# Patient Record
Sex: Female | Born: 1965 | Race: White | Hispanic: No | Marital: Married | Smoking: Never smoker
Health system: Southern US, Community
[De-identification: ages and names within clinical notes are randomized; demographics above are authoritative.]

## PROBLEM LIST (undated history)

## (undated) HISTORY — PX: REDUCTION MAMMAPLASTY: SUR839

---

## 2013-02-24 DIAGNOSIS — E559 Vitamin D deficiency, unspecified: Secondary | ICD-10-CM | POA: Insufficient documentation

## 2015-12-07 ENCOUNTER — Ambulatory Visit
Admission: EM | Admit: 2015-12-07 | Discharge: 2015-12-07 | Disposition: A | Discharge status: Home / Self Care | Payer: No Typology Code available for payment source | Attending: Family Medicine | Admitting: Family Medicine

## 2015-12-07 ENCOUNTER — Encounter: Payer: Self-pay | Admitting: *Deleted

## 2015-12-07 ENCOUNTER — Ambulatory Visit (INDEPENDENT_AMBULATORY_CARE_PROVIDER_SITE_OTHER): Payer: No Typology Code available for payment source

## 2015-12-07 DIAGNOSIS — M25511 Pain in right shoulder: Secondary | ICD-10-CM

## 2015-12-07 DIAGNOSIS — M7501 Adhesive capsulitis of right shoulder: Secondary | ICD-10-CM | POA: Diagnosis not present

## 2015-12-07 DIAGNOSIS — M7521 Bicipital tendinitis, right shoulder: Secondary | ICD-10-CM

## 2015-12-07 MED ORDER — MELOXICAM 15 MG PO TABS: 15.0000 mg | ORAL_TABLET | Freq: Every day | ORAL | Status: DC

## 2015-12-07 MED ORDER — ORPHENADRINE CITRATE ER 100 MG PO TB12: 100.0000 mg | ORAL_TABLET | Freq: Two times a day (BID) | ORAL | Status: DC
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
## 2015-12-07 NOTE — Discharge Instructions (Signed)
Shoulder Pain The shoulder is the joint that connects your arm to your body. Muscles and band-like tissues that connect bones to muscles (tendons) hold the joint together. Shoulder pain is felt if an injury or medical problem affects one or more parts of the shoulder. HOME CARE   Put ice on the sore area.  Put ice in a plastic bag.  Place a towel between your skin and the bag.  Leave the ice on for 15-20 minutes, 03-04 times a day for the first 2 days.  Stop using cold packs if they do not help with the pain.  If you were given something to keep your shoulder from moving (sling; shoulder immobilizer), wear it as told. Only take it off to shower or bathe.  Move your arm as little as possible, but keep your hand moving to prevent puffiness (swelling).  Squeeze a soft ball or foam pad as much as possible to help prevent swelling.  Take medicine as told by your doctor. GET HELP IF:  You have progressing new pain in your arm, hand, or fingers.  Your hand or fingers get cold.  Your medicine does not help lessen your pain. GET HELP RIGHT AWAY IF:   Your arm, hand, or fingers are numb or tingling.  Your arm, hand, or fingers are puffy (swollen), painful, or turn white or blue. MAKE SURE YOU:   Understand these instructions.  Will watch your condition.  Will get help right away if you are not doing well or get worse.   This information is not intended to replace advice given to you by your health care provider. Make sure you discuss any questions you have with your health care provider.   Document Released: 03/03/2008 Document Revised: 10/06/2014 Document Reviewed: 01/08/2015 Elsevier Interactive Patient Education 2016 [employer]  Shoulder Range of Motion Exercises Shoulder range of motion (ROM) exercises are designed to keep the shoulder moving freely. They are often recommended for people who have shoulder pain. MOVEMENT EXERCISE When you are able, do this exercise 5-6  days per week, or as told by your health care provider. Work toward doing 2 sets of 10 swings. Pendulum Exercise How To Do This Exercise Lying Down  Lie face-down on a bed with your abdomen close to the side of the bed.  Let your arm hang over the side of the bed.  Relax your shoulder, arm, and hand.  Slowly and gently swing your arm forward and back. Do not use your neck muscles to swing your arm. They should be relaxed. If you are struggling to swing your arm, have someone gently swing it for you. When you do this exercise for the first time, swing your arm at a 15 degree angle for 15 seconds, or swing your arm 10 times. As pain lessens over time, increase the angle of the swing to 30-45 degrees.  Repeat steps 1-4 with the other arm. How To Do This Exercise While Standing  Stand next to a sturdy chair or table and hold on to it with your hand.  Bend forward at the waist.  Bend your knees slightly.  Relax your other arm and let it hang limp.  Relax the shoulder blade of the arm that is hanging and let it drop.  While keeping your shoulder relaxed, use body motion to swing your arm in small circles. The first time you do this exercise, swing your arm for about 30 seconds or 10 times. When you do it next time, swing  your arm for a little longer.  Stand up tall and relax.  Repeat steps 1-7, this time changing the direction of the circles.  Repeat steps 1-8 with the other arm. STRETCHING EXERCISES Do these exercises 3-4 times per day on 5-6 days per week or as told by your health care provider. Work toward holding the stretch for 20 seconds. Stretching Exercise 1  Lift your arm straight out in front of you.  Bend your arm 90 degrees at the elbow (right angle) so your forearm goes across your body and looks like the letter "L."  Use your other arm to gently pull the elbow forward and across your body.  Repeat steps 1-3 with the other arm. Stretching Exercise 2 You will need a  towel or rope for this exercise.  Bend one arm behind your back with the palm facing outward.  Hold a towel with your other hand.  Reach the arm that holds the towel above your head, and bend that arm at the elbow. Your wrist should be behind your neck.  Use your free hand to grab the free end of the towel.  With the higher hand, gently pull the towel up behind you.  With the lower hand, pull the towel down behind you.  Repeat steps 1-6 with the other arm. STRENGTHENING EXERCISES Do each of these exercises at four different times of day (sessions) every day or as told by your health care provider. To begin with, repeat each exercise 5 times (repetitions). Work toward doing 3 sets of 12 repetitions or as told by your health care provider. Strengthening Exercise 1 You will need a light weight for this activity. As you grow stronger, you may use a heavier weight.  Standing with a weight in your hand, lift your arm straight out to the side until it is at the same height as your shoulder.  Bend your arm at 90 degrees so that your fingers are pointing to the ceiling.  Slowly raise your hand until your arm is straight up in the air.  Repeat steps 1-3 with the other arm. Strengthening Exercise 2 You will need a light weight for this activity. As you grow stronger, you may use a heavier weight.  Standing with a weight in your hand, gradually move your straight arm in an arc, starting at your side, then out in front of you, then straight up over your head.  Gradually move your other arm in an arc, starting at your side, then out in front of you, then straight up over your head.  Repeat steps 1-2 with the other arm. Strengthening Exercise 3 You will need an elastic band for this activity. As you grow stronger, gradually increase the size of the bands or increase the number of bands that you use at one time.  While standing, hold an elastic band in one hand and raise that arm up in the  air.  With your other hand, pull down the band until that hand is by your side.  Repeat steps 1-2 with the other arm.   This information is not intended to replace advice given to you by your health care provider. Make sure you discuss any questions you have with your health care provider.   Document Released: 06/14/2003 Document Revised: 01/30/2015 Document Reviewed: 09/11/2014 Elsevier Interactive Patient Education 2016 [employer]  Adhesive Capsulitis Adhesive capsulitis is inflammation of the tendons and ligaments that surround the shoulder joint (shoulder capsule). This condition causes the shoulder to become  stiff and painful to move. Adhesive capsulitis is also called frozen shoulder. CAUSES This condition may be caused by:  An injury to the shoulder joint.  Straining the shoulder.  Not moving the shoulder for a period of time. This can happen if your arm was injured or in a sling.  Long-standing health problems, such as:  Diabetes.  Thyroid problems.  Heart disease.  Stroke.  Rheumatoid arthritis.  Lung disease. In some cases, the cause may not be known. RISK FACTORS This condition is more likely to develop in:  Women.  People who are older than 50 years of age. SYMPTOMS Symptoms of this condition include:  Pain in the shoulder when moving the arm. There may also be pain when parts of the shoulder are touched. The pain is worse at night or when at rest.  Soreness or aching in the shoulder.  Inability to move the shoulder normally.  Muscle spasms. DIAGNOSIS This condition is diagnosed with a physical exam and imaging tests, such as an X-ray or MRI. TREATMENT This condition may be treated with:  Treatment of the underlying cause or condition.  Physical therapy. This involves performing exercises to get the shoulder moving again.  Medicine. Medicine may be given to relieve pain, inflammation, or muscle spasms.  Steroid injections into the  shoulder joint.  Shoulder manipulation. This is a procedure to move the shoulder into another position. It is done after you are given a medicine to make you fall asleep (general anesthetic). The joint may also be injected with salt water at high pressure to break down scarring.  Surgery. This may be done in severe cases when other treatments have failed. Although most people recover completely from adhesive capsulitis, some may not regain the full movement of the shoulder. HOME CARE INSTRUCTIONS  Take over-the-counter and prescription medicines only as told by your health care provider.  If you are being treated with physical therapy, follow instructions from your physical therapist.  Avoid exercises that put a lot of demand on your shoulder, such as throwing. These exercises can make pain worse.  If directed, apply ice to the injured area:  Put ice in a plastic bag.  Place a towel between your skin and the bag.  Leave the ice on for 20 minutes, 2-3 times per day. SEEK MEDICAL CARE IF:  You develop new symptoms.  Your symptoms get worse.   This information is not intended to replace advice given to you by your health care provider. Make sure you discuss any questions you have with your health care provider.   Document Released: 07/13/2009 Document Revised: 06/06/2015 Document Reviewed: 01/08/2015 Elsevier Interactive Patient Education 2016 [employer]  Bicipital Tendonitis Bicipital tendonitis refers to redness, soreness, and swelling (inflammation) or irritation of the bicep tendon. The biceps muscle is located between the elbow and shoulder of the inner arm. The tendon heads, similar to pieces of rope, connect the bicep muscle to the shoulder socket. They are called short head and long head tendons. When tendonitis occurs, the long head tendon is inflamed and swollen, and may be thickened or partially torn.  Bicipital tendonitis can occur with other problems as well, such as  arthritis in the shoulder or acromioclavicular joints, tears in the tendons, or other rotator cuff problems.  CAUSES  Overuse of of the arms for overhead activities is the major cause of tendonitis. Many athletes, such as swimmers, baseball players, and tennis players are prone to bicipital tendonitis. Jobs that require manual labor or  routine chores, especially chores involving overhead activities can result in overuse and tendonitis. SYMPTOMS Symptoms may include:  Pain in and around the front of the shoulder. Pain may be worse with overhead motion.  Pain or aching that radiates down the arm.  Clicking or shifting sensations in the shoulder. DIAGNOSIS Your caregiver may perform the following:  Physical exam and tests of the biceps and shoulder to observe range of motion, strength, and stability.  X-rays or magnetic resonance imaging (MRI) to confirm the diagnosis. In most common cases, these tests are not necessary. Since other problems may exist in the shoulder or rotator cuff, additional tests may be recommended. TREATMENT Treatment may include the following:  Medications  Your caregiver may prescribe over-the-counter pain relievers.  Steroid injections, such as cortisone, may be recommended. These may help to reduce inflammation and pain.  Physical Therapy - Your caregiver may recommend gentle exercises with the arm. These can help restore strength and range of motion. They may be done at home or with a physical therapist's supervision and input.  Surgery - Arthroscopic or open surgery sometimes is necessary. Surgery may include:  Reattachment or repair of the tendon at the shoulder socket.  Removal of the damaged section of the tendon.  Anchoring the tendon to a different area of the shoulder (tenodesis). HOME CARE INSTRUCTIONS   Avoid overhead motion of the affected arm or any other motion that causes pain.  Take medication for pain as directed. Do not take these for  more than 3 weeks, unless directed to do so by your caregiver.  Ice the affected area for 20 minutes at a time, 3-4 times per day. Place a towel on the skin over the painful area and the ice or cold pack over the towel. Do not place ice directly on the skin.  Perform gentle exercises at home as directed. These will increase strength and flexibility. PREVENTION  Modify your activities as much as possible to protect your arm. A physical therapist or sports medicine physician can help you understand options for safe motion.  Avoid repetitive overhead pulling, lifting, reaching, and throwing until your caregiver tells you it is ok to resume these activities. SEEK MEDICAL CARE IF:  Your pain worsens.  You have difficulty moving the affected arm.  You have trouble performing any of the self-care instructions. MAKE SURE YOU:   Understand these instructions.  Will watch your condition.  Will get help right away if you are not doing well or get worse.   This information is not intended to replace advice given to you by your health care provider. Make sure you discuss any questions you have with your health care provider.   Document Released: 10/18/2010 Document Revised: 12/08/2011 Document Reviewed: 04/04/2015 Elsevier Interactive Patient Education [employer]

## 2016-01-31 DIAGNOSIS — E611 Iron deficiency: Secondary | ICD-10-CM | POA: Insufficient documentation

## 2017-09-15 DIAGNOSIS — Z800 Family history of malignant neoplasm of digestive organs: Secondary | ICD-10-CM | POA: Insufficient documentation

## 2017-11-15 DIAGNOSIS — I341 Nonrheumatic mitral (valve) prolapse: Secondary | ICD-10-CM | POA: Insufficient documentation

## 2017-11-15 DIAGNOSIS — N6019 Diffuse cystic mastopathy of unspecified breast: Secondary | ICD-10-CM | POA: Insufficient documentation

## 2017-11-15 DIAGNOSIS — D1803 Hemangioma of intra-abdominal structures: Secondary | ICD-10-CM | POA: Insufficient documentation

## 2017-11-15 DIAGNOSIS — E538 Deficiency of other specified B group vitamins: Secondary | ICD-10-CM | POA: Insufficient documentation

## 2017-12-31 LAB — HM COLONOSCOPY

## 2019-07-15 ENCOUNTER — Other Ambulatory Visit: Payer: Self-pay | Admitting: Family Medicine

## 2019-07-15 DIAGNOSIS — Z1231 Encounter for screening mammogram for malignant neoplasm of breast: Secondary | ICD-10-CM

## 2019-10-07 ENCOUNTER — Ambulatory Visit
Admission: RE | Admit: 2019-10-07 | Discharge: 2019-10-07 | Disposition: A | Discharge status: Home / Self Care | Payer: BC Managed Care – PPO | Source: Ambulatory Visit | Attending: Family Medicine | Admitting: Family Medicine

## 2019-10-07 DIAGNOSIS — Z1231 Encounter for screening mammogram for malignant neoplasm of breast: Secondary | ICD-10-CM | POA: Insufficient documentation

## 2019-10-10 ENCOUNTER — Inpatient Hospital Stay
Admission: RE | Admit: 2019-10-10 | Discharge: 2019-10-10 | Disposition: A | Discharge status: Home / Self Care | Payer: Self-pay | Source: Ambulatory Visit | Attending: *Deleted | Admitting: *Deleted

## 2019-10-10 ENCOUNTER — Other Ambulatory Visit: Payer: Self-pay | Admitting: *Deleted

## 2019-10-10 DIAGNOSIS — Z1231 Encounter for screening mammogram for malignant neoplasm of breast: Secondary | ICD-10-CM

## 2020-08-08 IMAGING — MG DIGITAL SCREENING BILAT W/ TOMO W/ CAD
8 series · 8 of 24 positions shown · non-contrast
Comparison: Previous exam(s).

CLINICAL DATA: Screening.

EXAM:
DIGITAL SCREENING BILATERAL MAMMOGRAM WITH TOMO AND CAD

[R CC synth-2D]
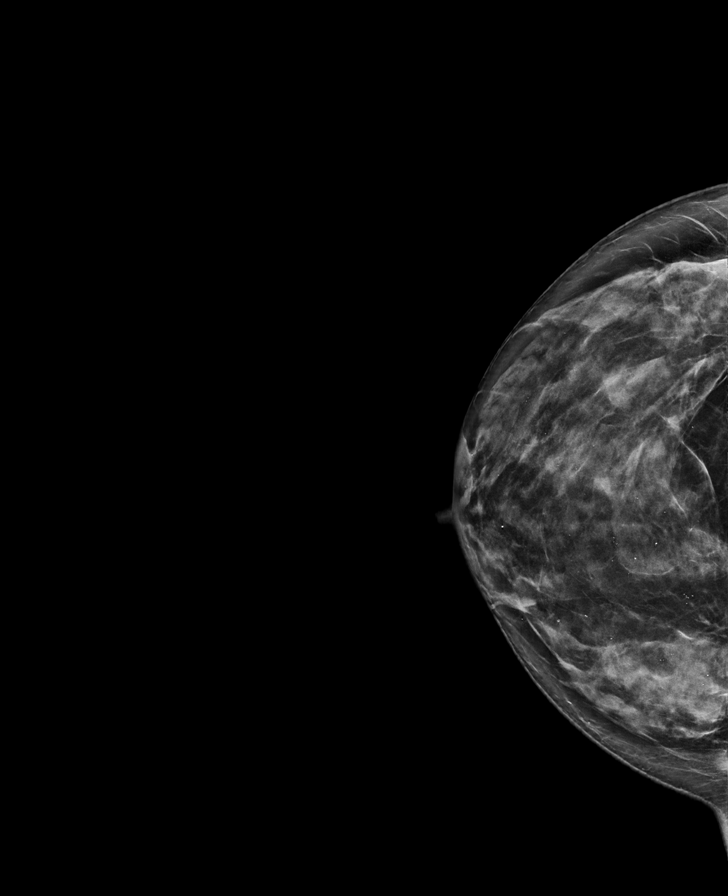

[L CC synth-2D]
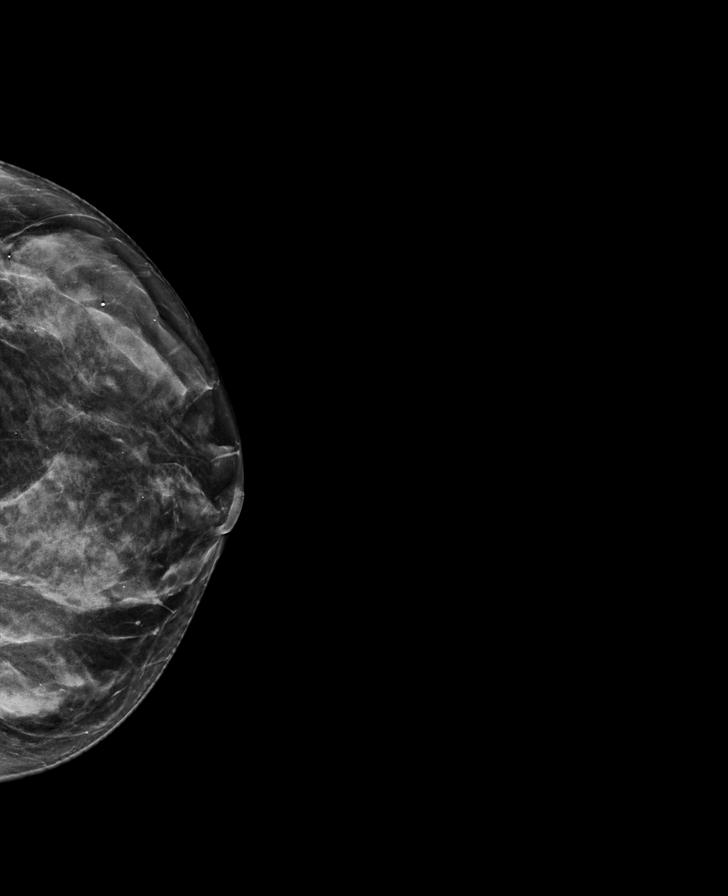

[L MLO synth-2D]
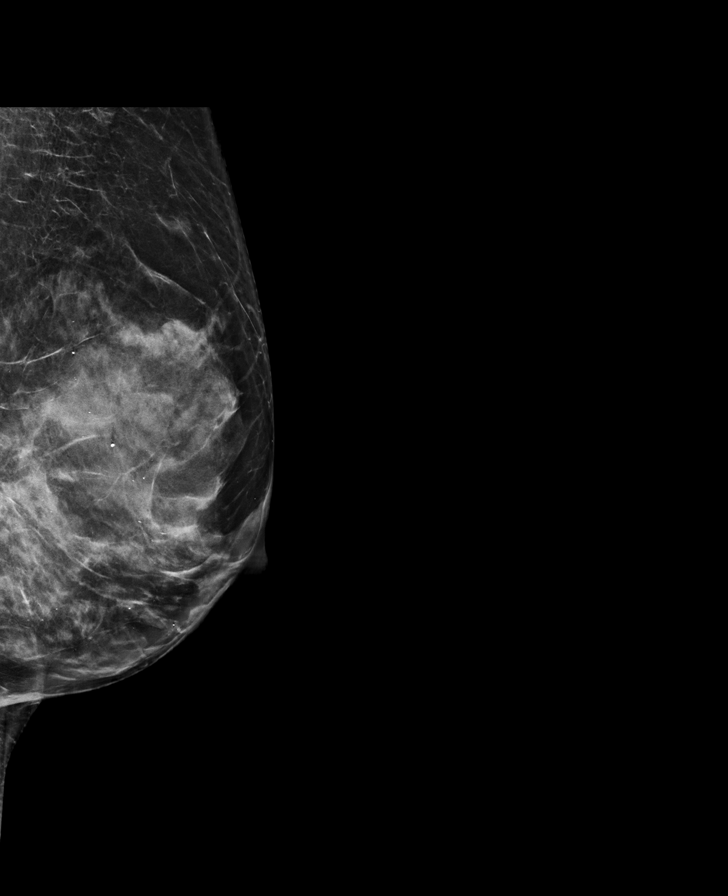

[R MLO synth-2D]
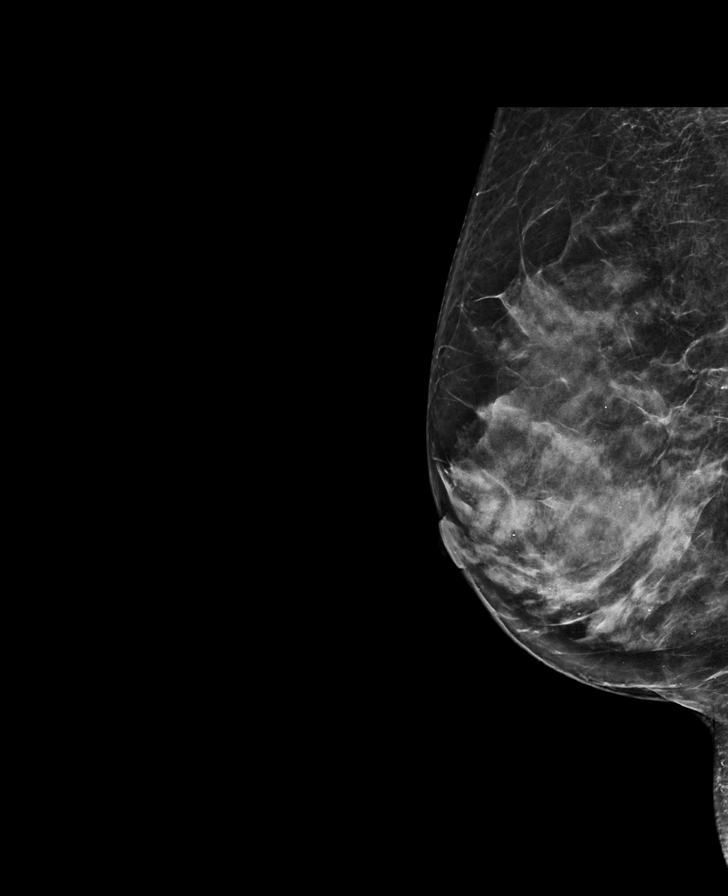

[L MLO tomo · tomo slice 37/73.0]
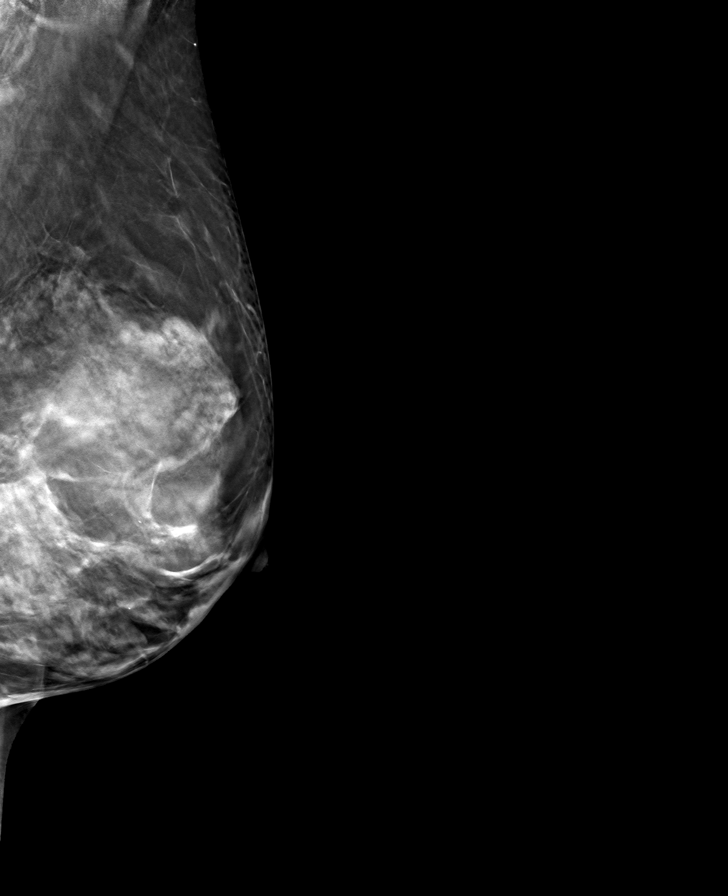

[R MLO tomo · tomo slice 39/77.0]
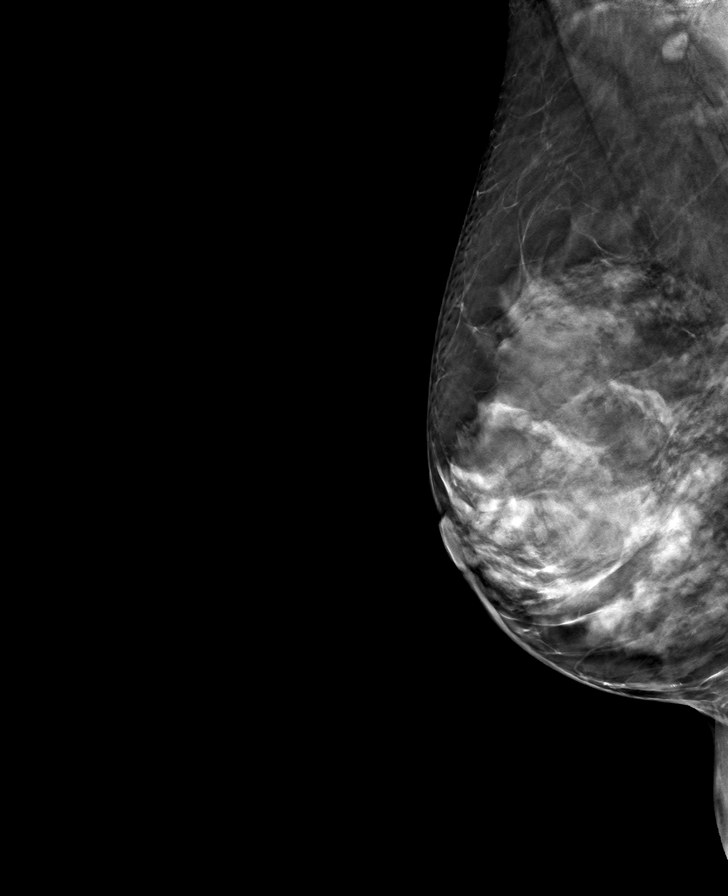

[L CC tomo · tomo slice 33/64.0]
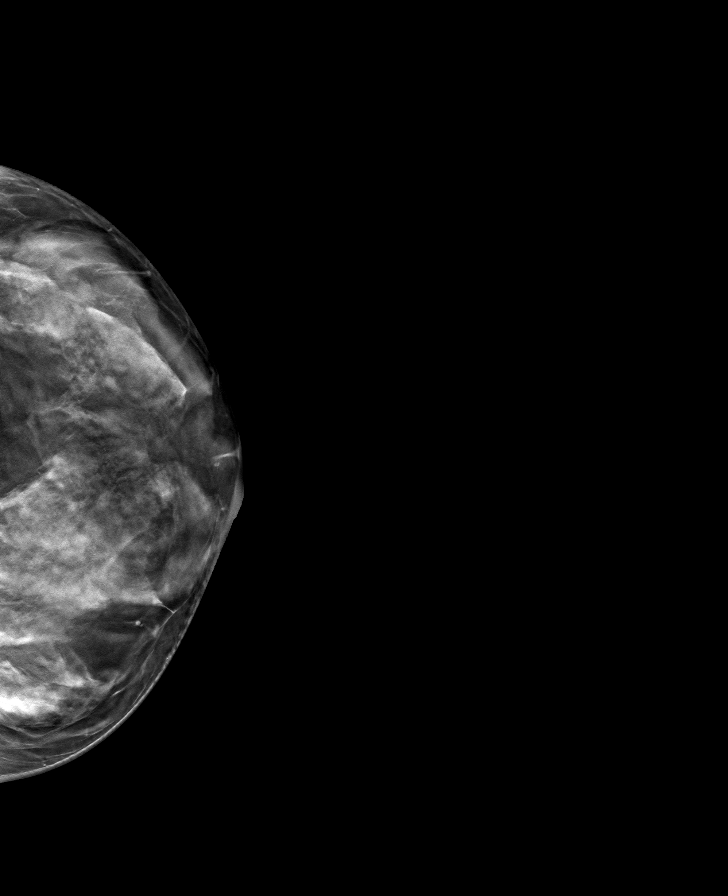

[R CC tomo · tomo slice 37/72.0]
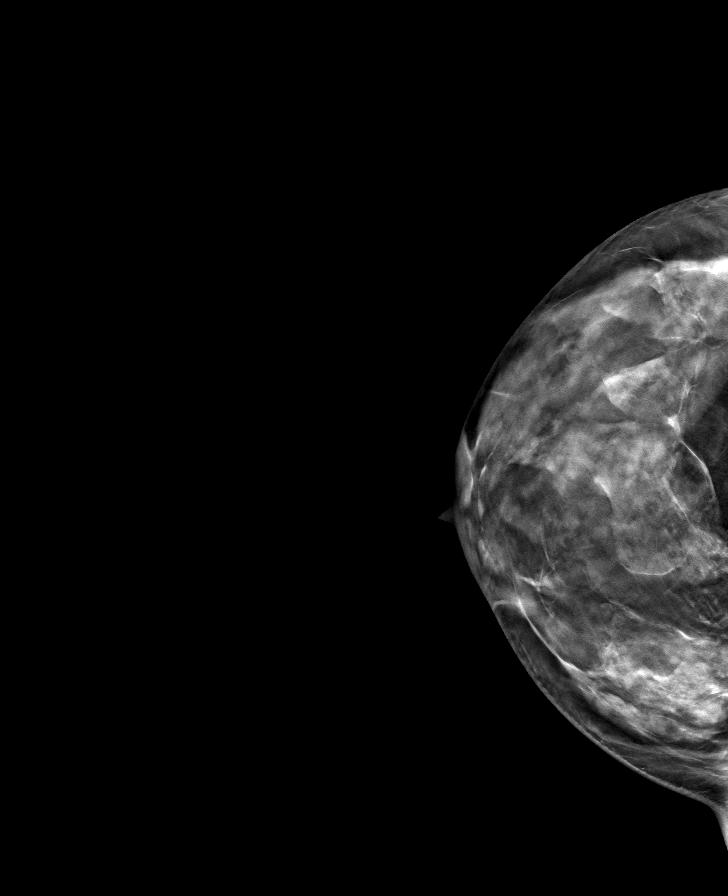

[8 of 24 positions shown; findings below may reference images not displayed]

ACR Breast Density Category d: The breast tissue is extremely dense,
which lowers the sensitivity of mammography
FINDINGS: There are no findings suspicious for malignancy. Images were
processed with CAD.
IMPRESSION: No mammographic evidence of malignancy. A result letter of this
screening mammogram will be mailed directly to the patient.

RECOMMENDATION:
Screening mammogram in one year. (Code:[id number])

BI-RADS CATEGORY  1: Negative.

## 2020-12-14 LAB — CBC AND DIFFERENTIAL
HCT: 40 (ref 36–46)
Hemoglobin: 13.3 (ref 12.0–16.0)
Platelets: 214 10*3/uL (ref 150–400)
WBC: 7.5

## 2020-12-14 LAB — BASIC METABOLIC PANEL
BUN: 17 (ref 4–21)
CO2: 25 — AB (ref 13–22)
Chloride: 106 (ref 99–108)
Creatinine: 0.8 (ref 0.5–1.1)
Glucose: 99
Potassium: 4.1 meq/L (ref 3.5–5.1)
Sodium: 140 (ref 137–147)

## 2020-12-14 LAB — COMPREHENSIVE METABOLIC PANEL
Calcium: 9.7 (ref 8.7–10.7)
eGFR: 88

## 2020-12-14 LAB — HEPATIC FUNCTION PANEL
ALT: 38 U/L — AB (ref 7–35)
AST: 28 (ref 13–35)
Alkaline Phosphatase: 46 (ref 25–125)
Bilirubin, Total: 1.5

## 2021-01-04 ENCOUNTER — Ambulatory Visit (INDEPENDENT_AMBULATORY_CARE_PROVIDER_SITE_OTHER): Payer: BC Managed Care – PPO

## 2021-01-04 ENCOUNTER — Encounter: Payer: Self-pay | Admitting: Emergency Medicine

## 2021-01-04 ENCOUNTER — Other Ambulatory Visit: Payer: Self-pay

## 2021-01-04 ENCOUNTER — Ambulatory Visit
Admission: EM | Admit: 2021-01-04 | Discharge: 2021-01-04 | Disposition: A | Discharge status: Home / Self Care | Payer: BC Managed Care – PPO | Attending: Emergency Medicine | Admitting: Emergency Medicine

## 2021-01-04 DIAGNOSIS — R059 Cough, unspecified: Secondary | ICD-10-CM

## 2021-01-04 DIAGNOSIS — Z8616 Personal history of COVID-19: Secondary | ICD-10-CM

## 2021-01-04 DIAGNOSIS — M546 Pain in thoracic spine: Secondary | ICD-10-CM | POA: Diagnosis present

## 2021-01-04 DIAGNOSIS — M549 Dorsalgia, unspecified: Secondary | ICD-10-CM

## 2021-01-04 LAB — D-DIMER, QUANTITATIVE: D-Dimer, Quant: 0.44 ug/mL-FEU (ref 0.00–0.50)

## 2021-01-04 MED ORDER — TIZANIDINE HCL 4 MG PO TABS: 4.0000 mg | ORAL_TABLET | Freq: Three times a day (TID) | ORAL | 0 refills | Status: DC | PRN

## 2021-01-04 MED ORDER — IBUPROFEN 600 MG PO TABS: 600.0000 mg | ORAL_TABLET | Freq: Four times a day (QID) | ORAL | 0 refills | Status: DC | PRN

## 2021-01-04 NOTE — ED Triage Notes (Addendum)
Patient c/o ongoing cough since Feb.  Patient states that she is now having mid back pain that started a week ago.  Patient denies fevers.  Patient states that she had COVID in Feb 2022.

## 2021-01-04 NOTE — Discharge Instructions (Addendum)
This could be pleurisy or musculoskeletal.  I favor musculoskeletal cause because you were able to reproduce it with certain movements.  I have sent off a D-dimer.  If it is positive, you will need to go to the emergency department to rule out a blood clot in your lungs.  If it is negative, then we can stop worrying about a blood clot in your lungs.  Take 600 mg of ibuprofen combined with 1000 mg of Tylenol together 3-4 times a day as needed for pain.  Zanaflex is for muscle spasms and may help as well.

## 2021-01-04 NOTE — ED Provider Notes (Signed)
HPI  SUBJECTIVE:  Paula Obrien is a 55 y.o. female who presents with coughing, wheezing since having COVID on 11/09/2020.  She states that the cough is getting better.  She now has stabbing left-sided mid thoracic pain in between the midaxillary line and lateral scapula that has been present for maybe a week.  It is sharp, present only with certain movements and activities.  She also is reporting left posterior calf tightness for the past few days.  No change in physical activity, trauma to the chest.  Chest pain is not present with inspiration.  She has been doing lots of coughing since recovering from COVID.  Wheezing has resolved.  Denies other chest pain, shortness of breath, fevers or calf swelling, hemoptysis, surgery in the past 4 weeks, recent immobilization.  She has never had symptoms like this before.  She tried ibuprofen 400 mg without improvement in her symptoms.  Symptoms are better with sitting still, worse with using her back muscles, coughing, laughing.  No antipyretic in the past 6 hours.  Past medical history of exercise-induced asthma.  She is not a smoker.  No history of DVT, PE, cancer, OCP use, diabetes, hypertension, pneumothorax.  States that she talked to a nurse today regarding her symptoms who advised her to come into the urgent care for chest x-ray and possible D-dimer.  RKY:HCWCBJ, Joycie Peek, MD  History reviewed. No pertinent past medical history.  Past Surgical History:  Procedure Laterality Date  . REDUCTION MAMMAPLASTY Bilateral    2000    Family History  Problem Relation Age of Onset  . Breast cancer Cousin        mat  . Cancer Mother     Social History   Tobacco Use  . Smoking status: Never Smoker  . Smokeless tobacco: Never Used  Vaping Use  . Vaping Use: Never used  Substance Use Topics  . Alcohol use: No  . Drug use: Never    No current facility-administered medications for this encounter.  Current Outpatient Medications:  .   ibuprofen (ADVIL) 600 MG tablet, Take 1 tablet (600 mg total) by mouth every 6 (six) hours as needed., Disp: 30 tablet, Rfl: 0 .  tiZANidine (ZANAFLEX) 4 MG tablet, Take 1 tablet (4 mg total) by mouth every 8 (eight) hours as needed for muscle spasms., Disp: 30 tablet, Rfl: 0  No Known Allergies   ROS  As noted in HPI.   Physical Exam  BP 112/77 (BP Location: Left Arm)   Pulse 78   Temp 98.6 F (37 C) (Oral)   Resp 14   Ht 5\' 4"  (1.626 m)   Wt 61.2 kg   LMP 12/19/2020   SpO2 98%   BMI 23.17 kg/m   Constitutional: Well developed, well nourished, no acute distress Eyes:  EOMI, conjunctiva normal bilaterally HENT: Normocephalic, atraumatic,mucus membranes moist Respiratory: Normal inspiratory effort, good air movement, lungs clear bilaterally..  No chest wall, scapular tenderness.  No reproducible tenderness in the area of pain. Cardiovascular: Normal rate, regular rhythm, no murmurs rubs or gallops GI: nondistended skin: No rash, skin intact Musculoskeletal: Calves symmetric, nontender, no edema Neurologic: Alert & oriented x 3, no focal neuro deficits Psychiatric: Speech and behavior appropriate   ED Course   Medications - No data to display  Orders Placed This Encounter  Procedures  . DG Chest 2 View    Standing Status:   Standing    Number of Occurrences:   1    Order Specific Question:  Reason for Exam (SYMPTOM  OR DIAGNOSIS REQUIRED)    Answer:   cough for over a month; mid back pain that started a week ago.  COVID + in Feb 2022    Results for orders placed or performed during the hospital encounter of 01/04/21 (from the past 24 hour(s))  D-dimer, quantitative     Status: None   Collection Time: 01/04/21  1:55 PM  Result Value Ref Range   D-Dimer, Quant 0.44 0.00 - 0.50 ug/mL-FEU   DG Chest 2 View  Result Date: 01/04/2021 CLINICAL DATA:  Cough for greater than 1 month. Mid back pain for 1 week. History of COVID-19 in 10/2020. EXAM: CHEST - 2 VIEW  COMPARISON:  None. FINDINGS: The cardiomediastinal silhouette is within normal limits. The lungs are hyperinflated. There is mild pleural thickening in the lung apices. No airspace consolidation, edema, pleural effusion, pneumothorax is identified. There is mild thoracic levoscoliosis. IMPRESSION: No active cardiopulmonary disease. Electronically Signed   By: Sebastian Ache M.D.   On: 01/04/2021 12:51    ED Clinical Impression  1. Acute left-sided thoracic back pain      ED Assessment/Plan  While the pain is not reproducible on exam, she is able to reproduce it with certain movements.  This appears to be musculoskeletal.  Reviewed imaging independently.  No pneumothorax, pleural effusion, pulmonary edema or consolidation.  Some hyperinflation.  See radiology report for full details.  Cannot use PERC criteria due to age.  Given that she recently had COVID and is reporting left posterior calf tightness for the past 2 days, will check a D-dimer.  Wells score 3 due to PE being equally likely as #1 diagnosis of musculoskeletal pain.  Will call patient [phone] if it is positive and will advise her to go to the emergency department.  In the meantime will treat as musculoskeletal chest wall pain versus pleurisy.  Will send her home with Tylenol/ibuprofen, Zanaflex.  D dimer negative.  Will send MyChart note.  PE unlikely.  Will stop work-up  Discussed labs, imaging, MDM, treatment plan, and plan for follow-up with patient. Discussed sn/sx that should prompt return to the ED. patient agrees with plan.   Meds ordered this encounter  Medications  . ibuprofen (ADVIL) 600 MG tablet    Sig: Take 1 tablet (600 mg total) by mouth every 6 (six) hours as needed.    Dispense:  30 tablet    Refill:  0  . tiZANidine (ZANAFLEX) 4 MG tablet    Sig: Take 1 tablet (4 mg total) by mouth every 8 (eight) hours as needed for muscle spasms.    Dispense:  30 tablet    Refill:  0    *This clinic note was created  using Scientist, clinical (histocompatibility and immunogenetics). Therefore, there may be occasional mistakes despite careful proofreading.  ?    Domenick Gong, MD 01/05/21 1105

## 2021-06-20 LAB — LIPID PANEL
Cholesterol: 240 — AB (ref 0–200)
HDL: 56 (ref 35–70)
LDL Cholesterol: 147
Triglycerides: 186 — AB (ref 40–160)

## 2021-06-20 LAB — IRON,TIBC AND FERRITIN PANEL
%SAT: 20
Iron: 73
TIBC: 364

## 2021-06-20 LAB — POCT ERYTHROCYTE SEDIMENTATION RATE, NON-AUTOMATED: Sed Rate: 8

## 2021-06-20 LAB — VITAMIN B12: Vitamin B-12: 207

## 2021-06-20 LAB — VITAMIN D 25 HYDROXY (VIT D DEFICIENCY, FRACTURES): Vit D, 25-Hydroxy: 35

## 2021-10-10 LAB — HM MAMMOGRAPHY

## 2022-12-24 LAB — RESULTS CONSOLE HPV: CHL HPV: NEGATIVE

## 2022-12-24 LAB — HM PAP SMEAR: HM Pap smear: NORMAL

## 2022-12-24 LAB — HM MAMMOGRAPHY

## 2023-01-23 DIAGNOSIS — N950 Postmenopausal bleeding: Secondary | ICD-10-CM | POA: Insufficient documentation

## 2023-06-30 ENCOUNTER — Encounter: Payer: Self-pay | Admitting: Internal Medicine

## 2023-06-30 ENCOUNTER — Ambulatory Visit (INDEPENDENT_AMBULATORY_CARE_PROVIDER_SITE_OTHER): Payer: BC Managed Care – PPO | Admitting: Internal Medicine

## 2023-06-30 VITALS — BP 112/72 | HR 84 | Temp 98.0°F | Resp 14 | Ht 64.75 in | Wt 140.8 lb

## 2023-06-30 DIAGNOSIS — Z8639 Personal history of other endocrine, nutritional and metabolic disease: Secondary | ICD-10-CM | POA: Insufficient documentation

## 2023-06-30 DIAGNOSIS — N950 Postmenopausal bleeding: Secondary | ICD-10-CM

## 2023-06-30 DIAGNOSIS — E538 Deficiency of other specified B group vitamins: Secondary | ICD-10-CM

## 2023-06-30 DIAGNOSIS — Z1231 Encounter for screening mammogram for malignant neoplasm of breast: Secondary | ICD-10-CM

## 2023-06-30 DIAGNOSIS — E785 Hyperlipidemia, unspecified: Secondary | ICD-10-CM | POA: Insufficient documentation

## 2023-06-30 DIAGNOSIS — K649 Unspecified hemorrhoids: Secondary | ICD-10-CM | POA: Diagnosis not present

## 2023-06-30 DIAGNOSIS — I341 Nonrheumatic mitral (valve) prolapse: Secondary | ICD-10-CM

## 2023-06-30 DIAGNOSIS — D1803 Hemangioma of intra-abdominal structures: Secondary | ICD-10-CM

## 2023-06-30 NOTE — Progress Notes (Signed)
Date:  06/30/2023   Name:  Paula Obrien   DOB:  08/02/1966   MRN:  [id number]   Chief Complaint: Establish Care  Rectal Bleeding  The problem occurs rarely. The problem has been unchanged. The pain is mild. Associated symptoms include hemorrhoids and rectal pain. Pertinent negatives include no fever, no abdominal pain, no diarrhea, no vaginal bleeding, no chest pain and no headaches.  Stress incontinence - she has some dark material at times on her panty liner and was concerned about bleeding.  Seen by GYN - [place] normal, Pap done.  The picture she showed me does not appear to be old blood - more like concentrated urine. Fibrocystic breasts - no current concerns but is overdue for mammogram.  Last done at [facility] with follow up US.  She was instructed to return to screening exams.  She would like to do that here. Family hx Colon Cancer - colonoscopy was due in April 2024 but was delayed by scheduling issues.  Reschedule is not until 11/2023.  She was hoping to get one done sooner but also wants a female provider.  Lab Results  Component Value Date   NA 140 12/14/2020   K 4.1 12/14/2020   CO2 25 (A) 12/14/2020   BUN 17 12/14/2020   CREATININE 0.8 12/14/2020   CALCIUM 9.7 12/14/2020   EGFR 88 12/14/2020   Lab Results  Component Value Date   CHOL 240 (A) 06/20/2021   HDL 56 06/20/2021   LDLCALC 147 06/20/2021   TRIG 186 (A) 06/20/2021   No results found for: "TSH" No results found for: "HGBA1C" Lab Results  Component Value Date   WBC 7.5 12/14/2020   HGB 13.3 12/14/2020   HCT 40 12/14/2020   PLT 214 12/14/2020   Lab Results  Component Value Date   ALT 38 (A) 12/14/2020   AST 28 12/14/2020   ALKPHOS 46 12/14/2020   Lab Results  Component Value Date   VD25OH 35 06/20/2021     Review of Systems  Constitutional:  Negative for chills, fatigue and fever.  HENT:  Positive for sinus pressure. Negative for trouble swallowing.   Respiratory:  Negative for chest tightness  and shortness of breath.   Cardiovascular:  Negative for chest pain and palpitations.  Gastrointestinal:  Positive for anal bleeding, hematochezia, hemorrhoids and rectal pain. Negative for abdominal pain, blood in stool, constipation and diarrhea.  Genitourinary:  Positive for frequency (mild stress incontinence). Negative for menstrual problem and vaginal bleeding.  Musculoskeletal:  Negative for arthralgias and joint swelling.  Skin:  Negative for wound.  Neurological:  Negative for dizziness, light-headedness and headaches.  Psychiatric/Behavioral:  Negative for dysphoric mood and sleep disturbance. The patient is not nervous/anxious.     Patient Active Problem List   Diagnosis Date Noted   History of vitamin D deficiency 06/30/2023   Mild hyperlipidemia 06/30/2023   Postmenopausal bleeding 01/23/2023   Fibrocystic breast 11/15/2017   Liver hemangioma 11/15/2017   Mitral valve prolapse 11/15/2017   Vitamin B12 deficiency 11/15/2017   Family history of colon cancer 09/15/2017    No Known Allergies  Past Surgical History:  Procedure Laterality Date   REDUCTION MAMMAPLASTY Bilateral    2000    Social History   Tobacco Use   Smoking status: Never   Smokeless tobacco: Never  Vaping Use   Vaping status: Never Used  Substance Use Topics   Alcohol use: No   Drug use: Never     Medication list has  been reviewed and updated.  Current Meds  Medication Sig   Cholecalciferol 50 MCG (2000 UT) TABS Take by mouth.   cyanocobalamin (VITAMIN B12) 1000 MCG tablet Place under the tongue.   Multiple Vitamin (MULTI-VITAMIN) tablet Take 1 tablet by mouth daily.       06/30/2023    3:47 PM  GAD 7 : Generalized Anxiety Score  Nervous, Anxious, on Edge 0  Control/stop worrying 0  Worry too much - different things 0  Trouble relaxing 0  Restless 0  Easily annoyed or irritable 0  Afraid - awful might happen 0  Total GAD 7 Score 0       06/30/2023    3:42 PM  Depression  screen PHQ 2/9  Decreased Interest 0  Down, Depressed, Hopeless 0  PHQ - 2 Score 0  Altered sleeping 0  Tired, decreased energy 0  Change in appetite 0  Feeling bad or failure about yourself  0  Trouble concentrating 0  Moving slowly or fidgety/restless 0  Suicidal thoughts 0  PHQ-9 Score 0    BP Readings from Last 3 Encounters:  06/30/23 112/72  01/04/21 112/77  12/07/15 113/77    Physical Exam Vitals and nursing note reviewed.  Constitutional:      General: She is not in acute distress.    Appearance: Normal appearance. She is well-developed.  HENT:     Head: Normocephalic and atraumatic.  Neck:     Vascular: No carotid bruit.  Cardiovascular:     Rate and Rhythm: Normal rate and regular rhythm.     Pulses: Normal pulses.     Heart sounds: No murmur heard. Pulmonary:     Effort: Pulmonary effort is normal. No respiratory distress.     Breath sounds: No wheezing or rhonchi.  Abdominal:     General: Abdomen is flat.     Palpations: Abdomen is soft.     Tenderness: There is no abdominal tenderness.  Musculoskeletal:     Cervical back: Normal range of motion.     Right lower leg: No edema.  Lymphadenopathy:     Cervical: No cervical adenopathy.  Skin:    General: Skin is warm and dry.     Findings: No rash.  Neurological:     General: No focal deficit present.     Mental Status: She is alert and oriented to person, place, and time.  Psychiatric:        Mood and Affect: Mood normal.        Behavior: Behavior normal.     Wt Readings from Last 3 Encounters:  06/30/23 140 lb 12.8 oz (63.9 kg)  01/04/21 135 lb (61.2 kg)  12/07/15 118 lb (53.5 kg)    BP 112/72 (BP Location: Right Arm, Patient Position: Sitting, Cuff Size: Normal)   Pulse 84   Temp 98 F (36.7 C) (Oral)   Resp 14   Ht 5' 4.75" (1.645 m) Comment: measured  Wt 140 lb 12.8 oz (63.9 kg)   LMP 12/19/2020   SpO2 96%   BMI 23.61 kg/m   Assessment and Plan:  Problem List Items Addressed  This Visit       Unprioritized   History of vitamin D deficiency    On daily supplements      Liver hemangioma   Mild hyperlipidemia   Mitral valve prolapse   Postmenopausal bleeding   Vitamin B12 deficiency    Continue oral supplements      Other Visit Diagnoses  Hemorrhoids, unspecified hemorrhoid type    -  Primary   Relevant Orders   Ambulatory referral to Gastroenterology   Encounter for screening mammogram for breast cancer       Relevant Orders   MM 3D SCREENING MAMMOGRAM BILATERAL BREAST        Return in about 4 months (around 10/31/2023) for CPX.    Reubin Milan, MD [facility] [place]

## 2023-06-30 NOTE — Patient Instructions (Addendum)
Call [facility] to schedule your mammogram at [phone].  My Fitness Pal  MyPlate

## 2023-06-30 NOTE — Assessment & Plan Note (Signed)
Continue oral supplements

## 2023-06-30 NOTE — Assessment & Plan Note (Signed)
On daily supplements

## 2023-07-01 ENCOUNTER — Telehealth: Payer: Self-pay

## 2023-07-01 NOTE — Telephone Encounter (Signed)
We receive a referral for hemorrhoids but reviewing the chart patient has been having colonoscopy through [facility] and has one schedule In march. She states she is wanting to transferring her care to our office for everything because her doctor retired. She will call and cancel the colonoscopy with [facility]

## 2023-07-02 ENCOUNTER — Inpatient Hospital Stay
Admission: RE | Admit: 2023-07-02 | Discharge: 2023-07-02 | Disposition: A | Discharge status: Home / Self Care | Payer: Self-pay | Source: Ambulatory Visit | Attending: Internal Medicine | Admitting: Internal Medicine

## 2023-07-02 ENCOUNTER — Other Ambulatory Visit: Payer: Self-pay | Admitting: *Deleted

## 2023-07-02 DIAGNOSIS — Z1231 Encounter for screening mammogram for malignant neoplasm of breast: Secondary | ICD-10-CM

## 2023-07-15 ENCOUNTER — Ambulatory Visit
Admission: RE | Admit: 2023-07-15 | Discharge: 2023-07-15 | Disposition: A | Discharge status: Home / Self Care | Payer: BC Managed Care – PPO | Source: Ambulatory Visit | Attending: Internal Medicine | Admitting: Internal Medicine

## 2023-07-15 DIAGNOSIS — Z1231 Encounter for screening mammogram for malignant neoplasm of breast: Secondary | ICD-10-CM | POA: Insufficient documentation

## 2023-08-12 ENCOUNTER — Telehealth: Payer: Self-pay

## 2023-08-12 ENCOUNTER — Other Ambulatory Visit: Payer: Self-pay

## 2023-08-12 ENCOUNTER — Telehealth: Payer: Self-pay | Admitting: Internal Medicine

## 2023-08-12 DIAGNOSIS — Z1211 Encounter for screening for malignant neoplasm of colon: Secondary | ICD-10-CM

## 2023-08-12 MED ORDER — NA SULFATE-K SULFATE-MG SULF 17.5-3.13-1.6 GM/177ML PO SOLN: 354.0000 mL | Freq: Once | ORAL | 0 refills | Status: AC

## 2023-08-12 NOTE — Telephone Encounter (Signed)
Left Patient VM informing we originally placed a referral for her to see [facility] to have her hemorrhoids addressed  and then they could schedule a colonoscopy after. However, I spoke with Radene Knee at [facility] and told her I read her documentation in the chart, and went ahead and put in a new referral for just a colonoscopy. Told patient on her VM she should now be able to call them to schedule this.  - Paula Obrien

## 2023-08-12 NOTE — Telephone Encounter (Signed)
Contacted [facility] office about this. We placed a referral on 10/1 and they documented in the referral. Unsure of why they will not schedule patient.

## 2023-08-12 NOTE — Telephone Encounter (Signed)
Patient left a voicemail and states in the voicemail that she has a upcoming appointment with our office but wants to schedule a colonoscopy before the appointment so it is on the schedule so she can get this done before the end of the year. Called patient back and patient states that her mom died of colon cancer and she get colonoscopy every 5 years with [facility] and she wants to schedule this before the end of the year. She states she will still come to see Dr. Allegra Lai on the 25 for her hemorrhoids. Explained to her that if she added a EGD to the procedure we might have to change the date because there might not be enough time. She states she does not need that. We schedule it for 09/02/2023. After scheduling it I went to the referral and the referral was only sent for hemorrhoids not colonoscopy called patient back and explained to patient I am unable to schedule colonoscopy with out a referral that says colonoscopy unless she is seen on the 25 and Dr. Allegra Lai documents you need one. She states that she will call her PCP and see if they can send over a referral for a colonoscopy. Canceled the prep at the pharmacy and the colonoscopy on 09/02/2023 with trish in endo

## 2023-08-12 NOTE — Telephone Encounter (Signed)
Copied from CRM [phone]. Topic: General - Inquiry >> Aug 12, 2023  2:26 PM De Blanch wrote: Reason for CRM:Pt called in and stated she needs to schedule her colononoscopy; however, Dr. Corinne Ports did not send a referral for this only for hemorrhoids.  Pt was upset and requested for this to be taken care of as soon as possible. Stated [facility] will not schedule her without referral.  Please advise.

## 2023-08-24 ENCOUNTER — Ambulatory Visit: Payer: BC Managed Care – PPO | Admitting: Gastroenterology

## 2023-09-02 ENCOUNTER — Ambulatory Visit: Admit: 2023-09-02 | Payer: BC Managed Care – PPO | Admitting: Gastroenterology

## 2023-09-02 SURGERY — COLONOSCOPY WITH PROPOFOL
Anesthesia: General

## 2023-12-02 ENCOUNTER — Encounter: Payer: Self-pay | Admitting: Internal Medicine
